# Patient Record
Sex: Male | Born: 2000 | Race: Black or African American | Hispanic: No | Marital: Single | Smoking: Never smoker
Health system: Southern US, Community
[De-identification: ages and names within clinical notes are randomized; demographics above are authoritative.]

## PROBLEM LIST (undated history)

## (undated) DIAGNOSIS — L309 Dermatitis, unspecified: Secondary | ICD-10-CM

## (undated) HISTORY — PX: ARTHROSCOPIC REPAIR ACL: SUR80

## (undated) HISTORY — PX: CIRCUMCISION: SUR203

---
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
## 2021-06-05 ENCOUNTER — Other Ambulatory Visit: Payer: Self-pay

## 2021-06-05 ENCOUNTER — Emergency Department (HOSPITAL_BASED_OUTPATIENT_CLINIC_OR_DEPARTMENT_OTHER)
Admission: EM | Admit: 2021-06-05 | Discharge: 2021-06-05 | Disposition: A | Discharge status: Home / Self Care | Payer: Medicaid Other | Attending: Emergency Medicine | Admitting: Emergency Medicine

## 2021-06-05 ENCOUNTER — Encounter (HOSPITAL_BASED_OUTPATIENT_CLINIC_OR_DEPARTMENT_OTHER): Payer: Self-pay

## 2021-06-05 DIAGNOSIS — Z20822 Contact with and (suspected) exposure to covid-19: Secondary | ICD-10-CM | POA: Diagnosis not present

## 2021-06-05 DIAGNOSIS — J020 Streptococcal pharyngitis: Secondary | ICD-10-CM | POA: Diagnosis not present

## 2021-06-05 DIAGNOSIS — J029 Acute pharyngitis, unspecified: Secondary | ICD-10-CM | POA: Diagnosis present

## 2021-06-05 LAB — CBC WITH DIFFERENTIAL/PLATELET
Abs Immature Granulocytes: 0.03 10*3/uL (ref 0.00–0.07)
Basophils Absolute: 0 10*3/uL (ref 0.0–0.1)
Basophils Relative: 0 %
Eosinophils Absolute: 0 10*3/uL (ref 0.0–0.5)
Eosinophils Relative: 1 %
HCT: 44.6 % (ref 39.0–52.0)
Hemoglobin: 15 g/dL (ref 13.0–17.0)
Immature Granulocytes: 0 %
Lymphocytes Relative: 19 %
Lymphs Abs: 1.6 10*3/uL (ref 0.7–4.0)
MCH: 32.3 pg (ref 26.0–34.0)
MCHC: 33.6 g/dL (ref 30.0–36.0)
MCV: 95.9 fL (ref 80.0–100.0)
Monocytes Absolute: 0.8 10*3/uL (ref 0.1–1.0)
Monocytes Relative: 10 %
Neutro Abs: 5.8 10*3/uL (ref 1.7–7.7)
Neutrophils Relative %: 70 %
Platelets: 193 10*3/uL (ref 150–400)
RBC: 4.65 MIL/uL (ref 4.22–5.81)
RDW: 12.2 % (ref 11.5–15.5)
WBC: 8.3 10*3/uL (ref 4.0–10.5)
nRBC: 0 % (ref 0.0–0.2)

## 2021-06-05 LAB — BASIC METABOLIC PANEL
Anion gap: 7 (ref 5–15)
BUN: 11 mg/dL (ref 6–20)
CO2: 25 mmol/L (ref 22–32)
Calcium: 9.2 mg/dL (ref 8.9–10.3)
Chloride: 104 mmol/L (ref 98–111)
Creatinine, Ser: 1.12 mg/dL (ref 0.61–1.24)
GFR, Estimated: >60 mL/min (ref >60)
Glucose, Bld: 104 mg/dL — ABNORMAL HIGH (ref 70–99)
Potassium: 4.2 mmol/L (ref 3.5–5.1)
Sodium: 136 mmol/L (ref 135–145)

## 2021-06-05 LAB — RESP PANEL BY RT-PCR (FLU A&B, COVID) ARPGX2
Influenza A by PCR: NEGATIVE
Influenza B by PCR: NEGATIVE
SARS Coronavirus 2 by RT PCR: NEGATIVE

## 2021-06-05 LAB — GROUP A STREP BY PCR: Group A Strep by PCR: DETECTED — AB

## 2021-06-05 MED ORDER — DEXAMETHASONE SODIUM PHOSPHATE 10 MG/ML IJ SOLN
10.0000 mg | Freq: Once | INTRAMUSCULAR | Status: AC
  Administered 2021-06-05: 10 mg via INTRAVENOUS
  Filled 2021-06-05: qty 1

## 2021-06-05 MED ORDER — SODIUM CHLORIDE 0.9 % IV BOLUS
1000.0000 mL | Freq: Once | INTRAVENOUS | Status: AC
  Administered 2021-06-05: 1000 mL via INTRAVENOUS

## 2021-06-05 MED ORDER — PENICILLIN G BENZATHINE 1200000 UNIT/2ML IM SUSY
1.2000 10*6.[IU] | PREFILLED_SYRINGE | Freq: Once | INTRAMUSCULAR | Status: AC
  Administered 2021-06-05: 1.2 10*6.[IU] via INTRAMUSCULAR
  Filled 2021-06-05: qty 2

## 2021-06-05 NOTE — ED Triage Notes (Addendum)
Pt arrives ambulatory to ED with swelling to throat. Pt has a family member who has strep. ?

## 2021-06-05 NOTE — Discharge Instructions (Signed)
Home to rest and hydrate.  ?Motrin and Tylenol as needed as directed for throat pain. ?Return to the ER for new or worsening symptoms.  ? ?Replace your toothbrush tomorrow night to prevent reinfection.  ?

## 2021-06-05 NOTE — ED Notes (Signed)
ED Provider at bedside. 

## 2021-06-05 NOTE — ED Provider Notes (Signed)
?[facility] EMERGENCY DEPARTMENT ?Provider Note ? ? ?CSN: [id number] ?Arrival date & time: 06/05/21  6812 ? ?  ? ?History ? ?Chief Complaint  ?Patient presents with  ? Sore Throat  ? ? ?Ghassan Coggeshall is a 21 y.o. male. ? ?21 year old otherwise healthy male presents with complaint of throat swelling and painful swallowing, difficulty talking.  Patient states symptoms woke him from his sleep last night, he took Benadryl without improvement in his condition.  He denies difficulty breathing, wheezing, fevers or chills, sinus congestion or other URI symptoms.  Patient was exposed to his sister last week who has strep throat.  Patient denies heavy alcohol use, is a non-smoker.  No other complaints or concerns today. ? ? ?  ? ?Home Medications ?Prior to Admission medications   ?Not on File  ?   ? ?Allergies    ?Patient has no known allergies.   ? ?Review of Systems   ?Review of Systems ?Negative except as per HPI ?Physical Exam ?Updated Vital Signs ?BP 137/84   Pulse 72   Temp 98.7 ?F (37.1 ?C) (Oral)   Resp 18   Ht 5\' 11"  (1.803 m)   Wt 86.2 kg   SpO2 100%   BMI 26.51 kg/m?  ?Physical Exam ?Vitals and nursing note reviewed.  ?Constitutional:   ?   General: He is not in acute distress. ?   Appearance: He is well-developed. He is not diaphoretic.  ?HENT:  ?   Head: Normocephalic and atraumatic.  ?   Nose: No congestion or rhinorrhea.  ?   Mouth/Throat:  ?   Mouth: Mucous membranes are moist. No oral lesions.  ?   Pharynx: Uvula midline. Uvula swelling present. No oropharyngeal exudate.  ?   Tonsils: No tonsillar exudate or tonsillar abscesses. 2+ on the right. 2+ on the left.  ?Eyes:  ?   Conjunctiva/sclera: Conjunctivae normal.  ?Cardiovascular:  ?   Rate and Rhythm: Normal rate and regular rhythm.  ?   Heart sounds: Normal heart sounds.  ?Pulmonary:  ?   Effort: Pulmonary effort is normal.  ?   Breath sounds: Normal breath sounds.  ?Musculoskeletal:  ?   Cervical back: Neck supple.  ?Lymphadenopathy:  ?    Cervical: No cervical adenopathy.  ?Skin: ?   General: Skin is warm and dry.  ?Neurological:  ?   Mental Status: He is alert and oriented to person, place, and time.  ?Psychiatric:     ?   Behavior: Behavior normal.  ? ? ?ED Results / Procedures / Treatments   ?Labs ?(all labs ordered are listed, but only abnormal results are displayed) ?Labs Reviewed  ?GROUP A STREP BY PCR - Abnormal; Notable for the following components:  ?    Result Value  ? Group A Strep by PCR DETECTED (*)   ? All other components within normal limits  ?BASIC METABOLIC PANEL - Abnormal; Notable for the following components:  ? Glucose, Bld 104 (*)   ? All other components within normal limits  ?RESP PANEL BY RT-PCR (FLU A&B, COVID) ARPGX2  ?CBC WITH DIFFERENTIAL/PLATELET  ? ? ?EKG ?None ? ?Radiology ?No results found. ? ?Procedures ?Procedures  ? ? ?Medications Ordered in ED ?Medications  ?sodium chloride 0.9 % bolus 1,000 mL (1,000 mLs Intravenous New Bag/Given 06/05/21 0930)  ?dexamethasone (DECADRON) injection 10 mg (10 mg Intravenous Given 06/05/21 0935)  ?penicillin g benzathine (BICILLIN LA) 1200000 UNIT/2ML injection 1.2 Million Units (1.2 Million Units Intramuscular Given 06/05/21 1031)  ? ? ?  ED Course/ Medical Decision Making/ A&P ?  ?                        ?Medical Decision Making ?Amount and/or Complexity of Data Reviewed ?Labs: ordered. ? ?Risk ?Prescription drug management. ? ? ?21 year old male with complaint of sore throat onset last night, not improved with Benadryl at home.  On exam, found to have edema of the uvula which is midline.  Tonsils enlarged without touching, no exudate.  Patient is able to tolerate secretions, no respiratory difficulty, lungs clear.  Plan is to treat with IV fluids, Decadron.  Will check COVID/flu as well as strep.  Gust possible viral versus bacterial source, also discussed sleeping with her mouth open can also cause his uvula to swell. ? ?Patient is positive for strep, treated with IV fluids,  decadron, IM bicillin.  ?Negative for covid/flu. CBC and BMP without significant findings. ? ?On recheck, improvement in swelling of the uvula, still able to tolerate secretions, improvement in voice. ? ?Encouraged continuing fluids at home, motrin and tylenol as needed, return to ER for worsening or concerning symptoms.  ? ? ? ? ? ? ? ?Final Clinical Impression(s) / ED Diagnoses ?Final diagnoses:  ?Strep throat  ? ? ?Rx / DC Orders ?ED Discharge Orders   ? ? None  ? ?  ? ? ?  ?Jeannie Fend, PA-C ?06/05/21 1042 ? ?  ?Alvira Monday, MD ?06/07/21 2256 ? ?
